# Patient Record
Sex: Female | Born: 1970 | Race: Asian | Hispanic: No | Marital: Married | Smoking: Never smoker
Health system: Southern US, Community
[De-identification: ages and names within clinical notes are randomized; demographics above are authoritative.]

## PROBLEM LIST (undated history)

## (undated) DIAGNOSIS — K2270 Barrett's esophagus without dysplasia: Secondary | ICD-10-CM

## (undated) DIAGNOSIS — E785 Hyperlipidemia, unspecified: Secondary | ICD-10-CM

## (undated) DIAGNOSIS — K317 Polyp of stomach and duodenum: Secondary | ICD-10-CM

## (undated) DIAGNOSIS — K259 Gastric ulcer, unspecified as acute or chronic, without hemorrhage or perforation: Secondary | ICD-10-CM

## (undated) DIAGNOSIS — R251 Tremor, unspecified: Secondary | ICD-10-CM

## (undated) DIAGNOSIS — E559 Vitamin D deficiency, unspecified: Secondary | ICD-10-CM

## (undated) HISTORY — DX: Tremor, unspecified: R25.1

## (undated) HISTORY — DX: Hyperlipidemia, unspecified: E78.5

## (undated) HISTORY — DX: Barrett's esophagus without dysplasia: K22.70

## (undated) HISTORY — DX: Gastric ulcer, unspecified as acute or chronic, without hemorrhage or perforation: K25.9

## (undated) HISTORY — DX: Polyp of stomach and duodenum: K31.7

## (undated) HISTORY — DX: Vitamin D deficiency, unspecified: E55.9

## (undated) HISTORY — PX: OVARIAN CYST REMOVAL: SHX89

---

## 2015-05-14 ENCOUNTER — Other Ambulatory Visit: Payer: Self-pay | Admitting: Internal Medicine

## 2015-05-14 DIAGNOSIS — Z1231 Encounter for screening mammogram for malignant neoplasm of breast: Secondary | ICD-10-CM

## 2015-05-23 ENCOUNTER — Ambulatory Visit
Admission: RE | Admit: 2015-05-23 | Discharge: 2015-05-23 | Disposition: A | Discharge status: Home / Self Care | Payer: 59 | Source: Ambulatory Visit | Attending: Internal Medicine | Admitting: Internal Medicine

## 2015-05-23 DIAGNOSIS — Z1231 Encounter for screening mammogram for malignant neoplasm of breast: Secondary | ICD-10-CM

## 2016-06-03 IMAGING — MG MM SCREEN MAMMOGRAM BILATERAL
4 series · 4 of 4 positions shown · non-contrast
Comparison: None.

CLINICAL DATA: Screening. Baseline study.

EXAM:
DIGITAL SCREENING BILATERAL MAMMOGRAM WITH CAD

[R CC]
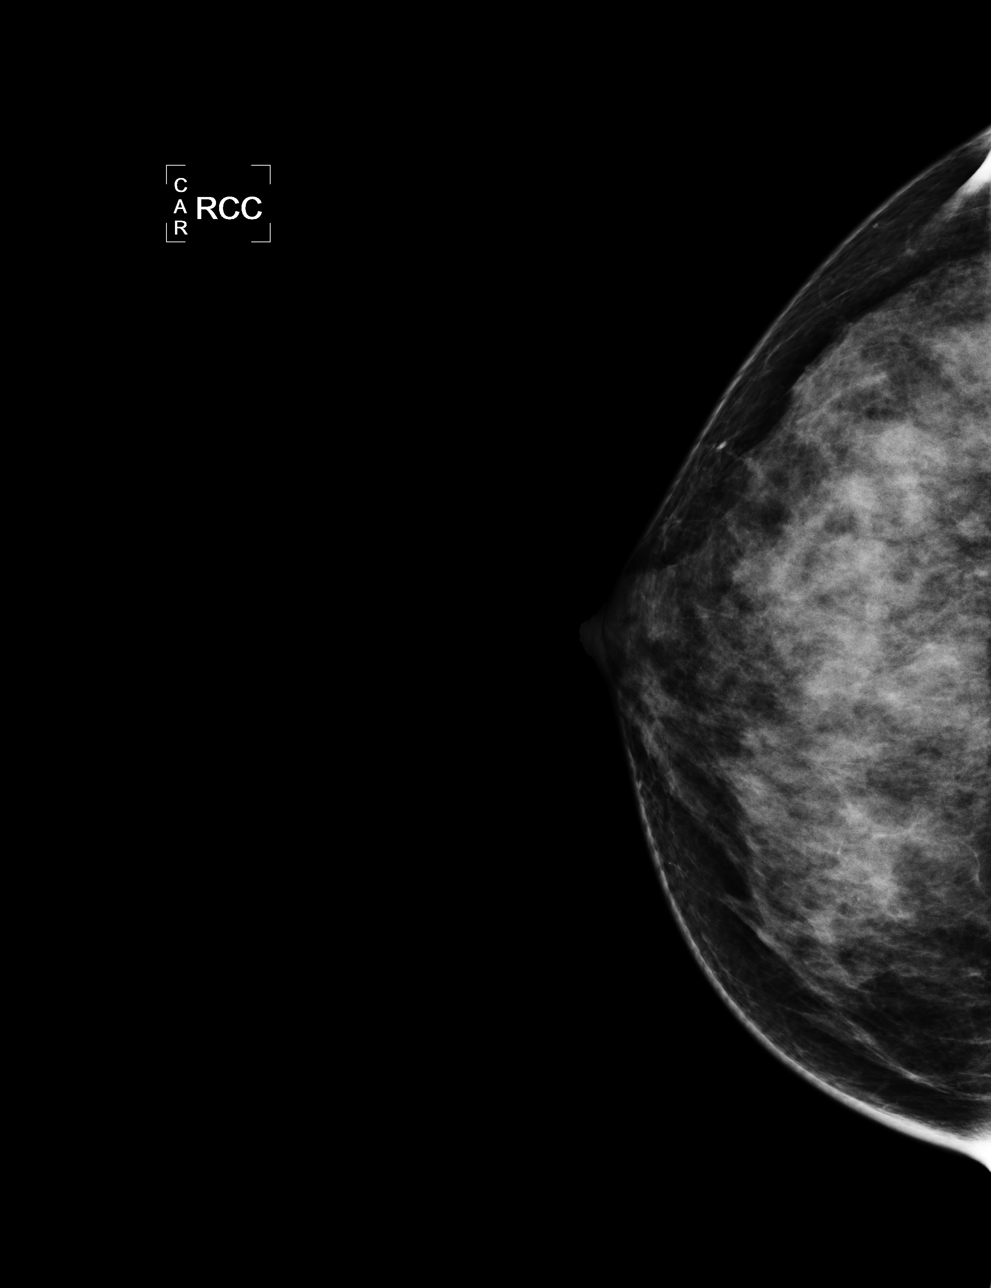

[L CC]
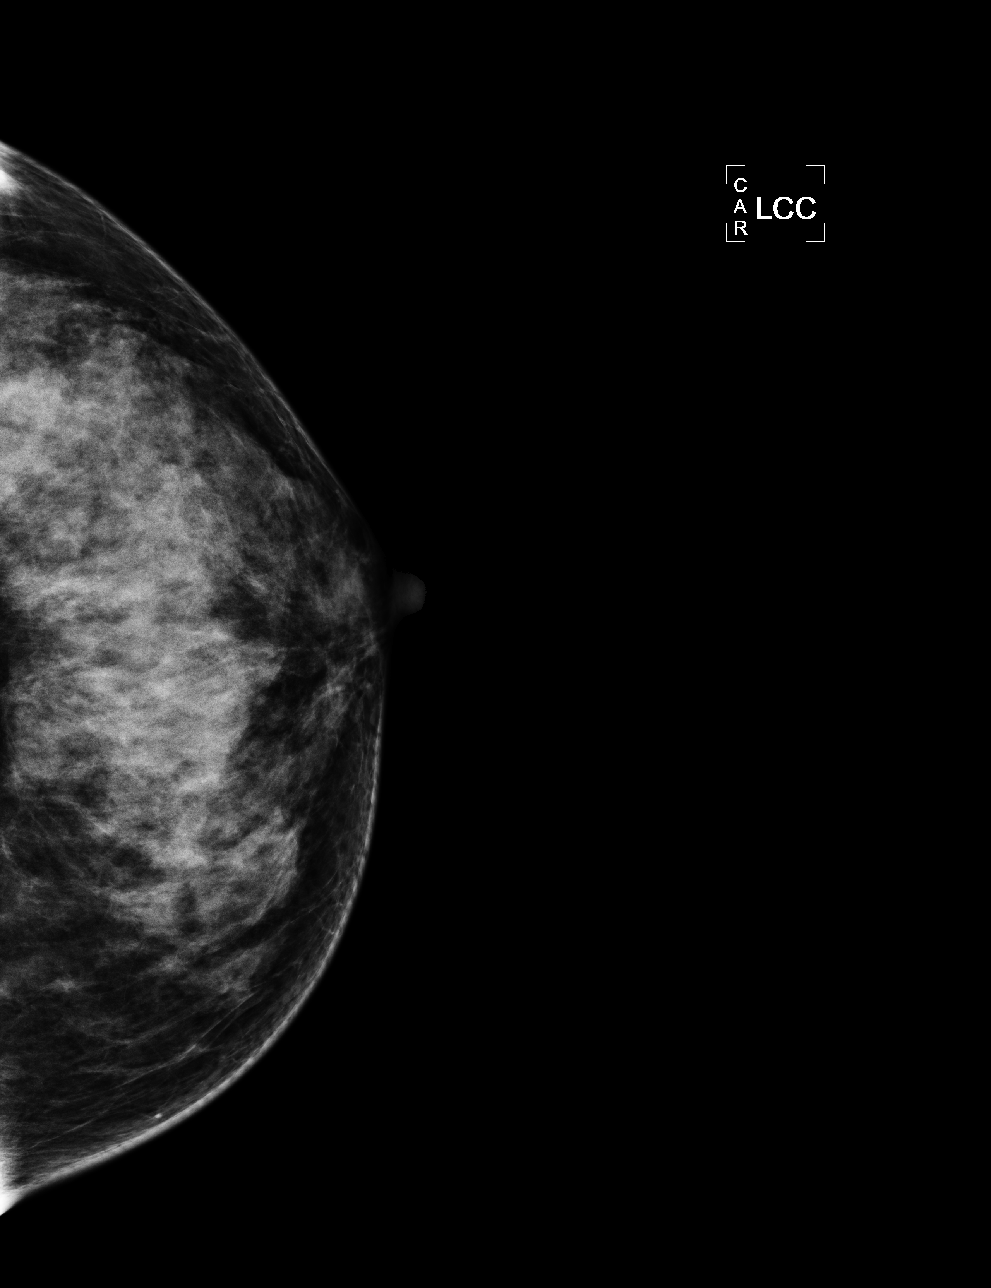

[L MLO]
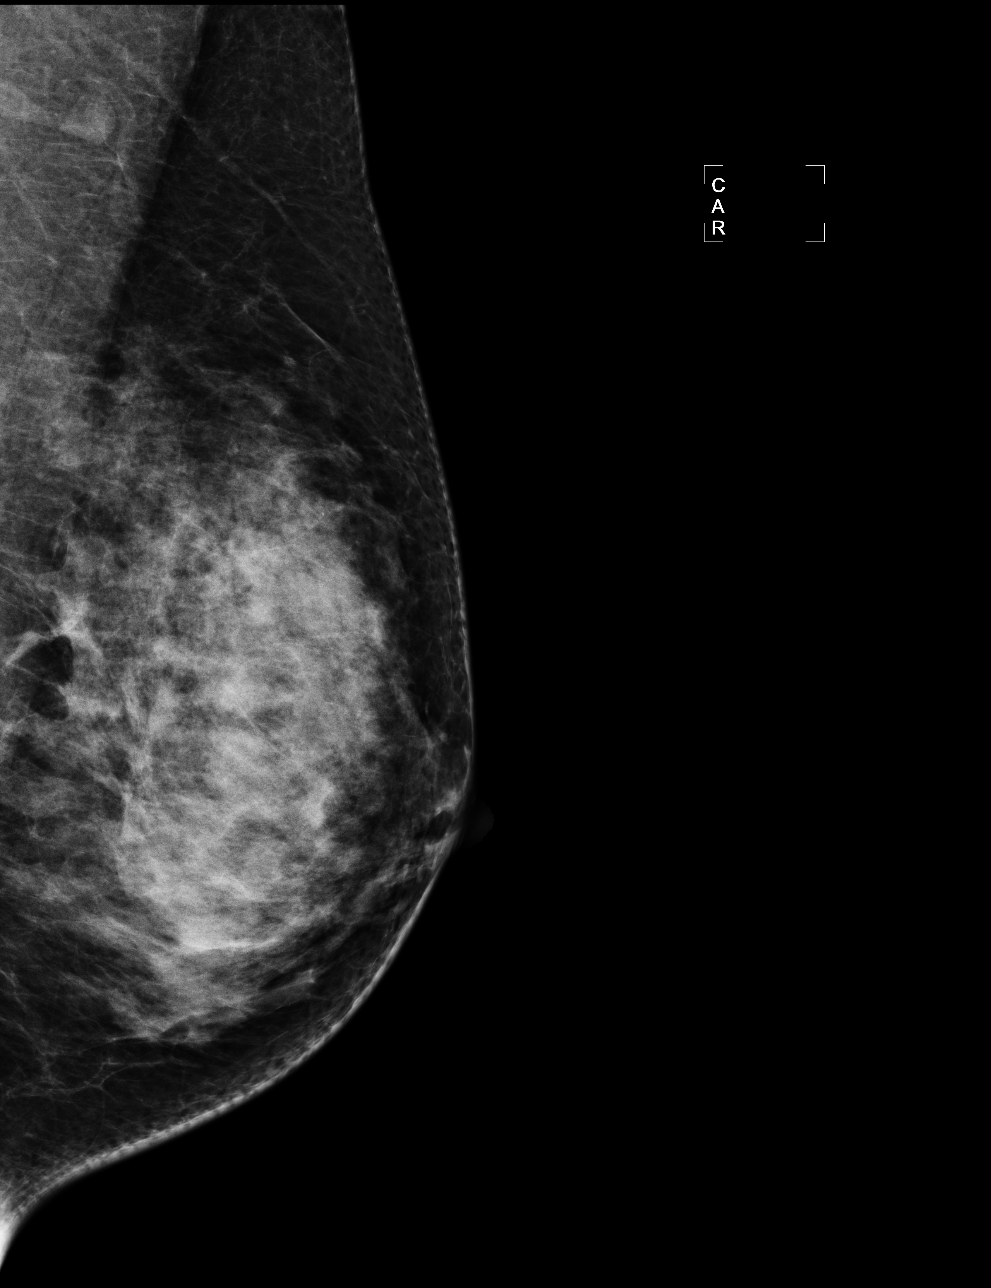

[R MLO]
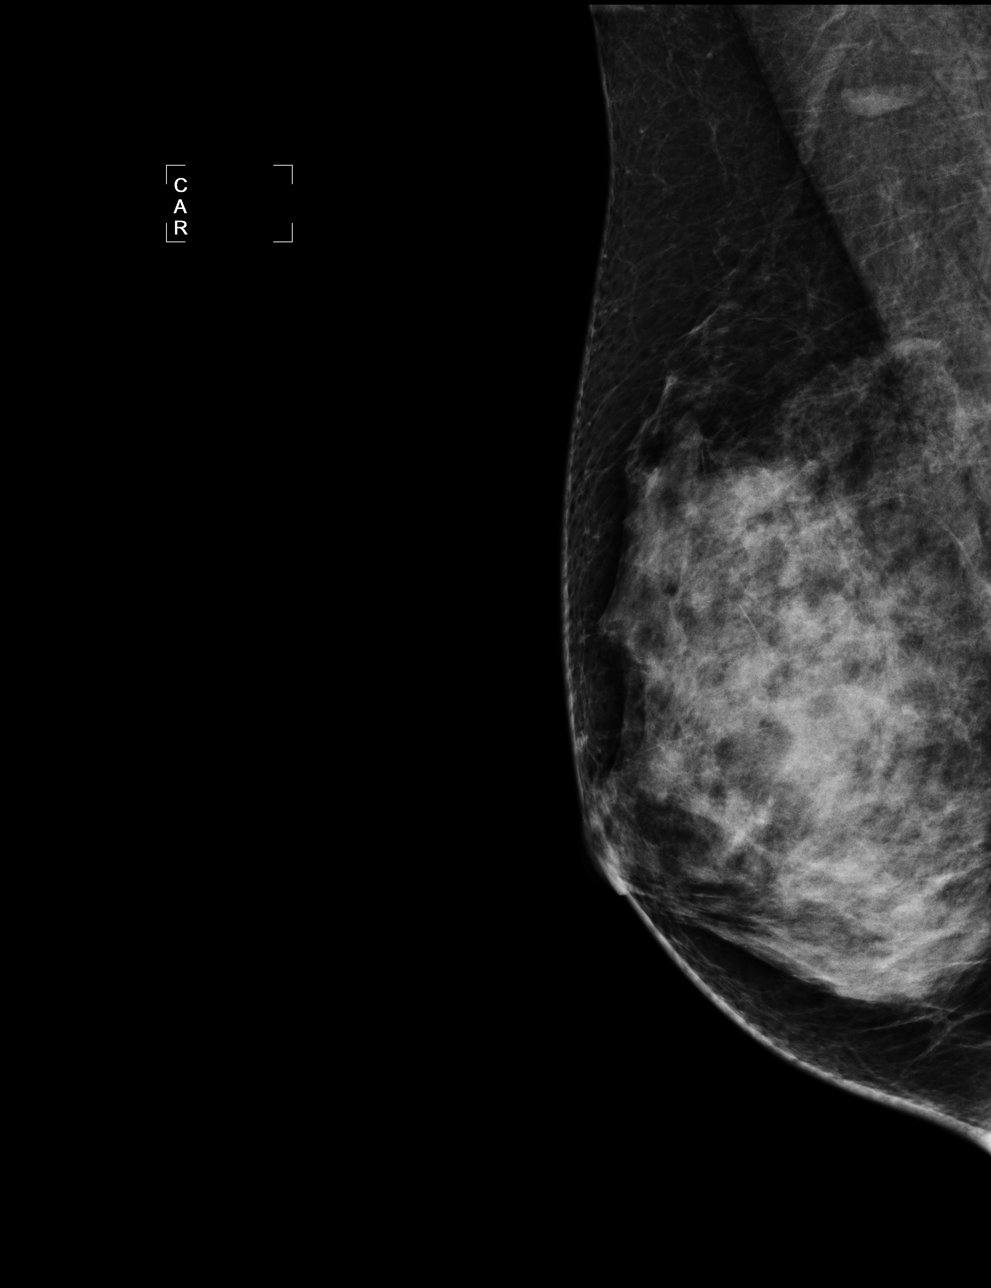

[4 of 4 positions shown; findings below may reference images not displayed]

ACR Breast Density Category d: The breast tissue is extremely dense,
which lowers the sensitivity of mammography.
FINDINGS: There are no findings suspicious for malignancy. Images were
processed with CAD.
IMPRESSION: No mammographic evidence of malignancy. A result letter of this
screening mammogram will be mailed directly to the patient.

RECOMMENDATION:
Screening mammogram in one year. (Code:[id number]-[id number])

BI-RADS CATEGORY  1: Negative.

## 2018-05-19 ENCOUNTER — Ambulatory Visit (INDEPENDENT_AMBULATORY_CARE_PROVIDER_SITE_OTHER): Payer: BLUE CROSS/BLUE SHIELD | Admitting: Neurology

## 2018-05-19 ENCOUNTER — Encounter: Payer: Self-pay | Admitting: *Deleted

## 2018-05-19 ENCOUNTER — Encounter: Payer: Self-pay | Admitting: Neurology

## 2018-05-19 VITALS — BP 119/78 | HR 78 | Ht 63.0 in | Wt 123.0 lb

## 2018-05-19 DIAGNOSIS — R251 Tremor, unspecified: Secondary | ICD-10-CM

## 2018-05-19 NOTE — Patient Instructions (Signed)
Essential tremor

## 2018-05-19 NOTE — Progress Notes (Signed)
PATIENT: Casey Harris DOB: 1971/01/13  Chief Complaint  Patient presents with  . Tremors    She is here with her son, Casey Harris, to have her left hand tremor evaluated.  Marland Kitchen PCP    Salli Real, MD     HISTORICAL  Casey Harris is a 47 year old female, seen in request by her primary care doctor Dr. 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   REVIEW OF SYSTEMS: Full 14 system review of systems performed and notable only for as above.  ALLERGIES: No Known Allergies  HOME MEDICATIONS: No current outpatient medications on file.   No current facility-administered medications for this visit.     PAST MEDICAL HISTORY: Past Medical History:  Diagnosis Date  . Barrett esophagus   . Gastric polyp   . Gastric ulcer   . Hyperlipemia   . Tremor of left hand   . Vitamin D deficiency     PAST SURGICAL HISTORY: History reviewed. No pertinent surgical history.  FAMILY HISTORY: History reviewed. No pertinent family history.  SOCIAL HISTORY: Social History   Socioeconomic History  . Marital status: Married    Spouse name: Not on file  . Number of children: Not on file  . Years of education: Not on file  . Highest education level: Not on file  Occupational History  . Not on file  Social Needs  . Financial resource strain: Not on file  . Food insecurity:    Worry: Not on file    Inability: Not on file  . Transportation needs:    Medical: Not on file    Non-medical: Not on file  Tobacco Use  . Smoking status: Never Smoker  . Smokeless tobacco: Never Used  Substance  and Sexual Activity  . Alcohol use: Not Currently  . Drug use: Never  . Sexual activity: Not on file  Lifestyle  . Physical activity:    Days per week: Not on file    Minutes per session: Not on file  . Stress: Not on file  Relationships  . Social connections:    Talks on phone: Not on file    Gets together: Not on file    Attends religious service: Not on file    Active member of club or organization: Not on file    Attends meetings of clubs or organizations: Not on file    Relationship status: Not on file  . Intimate partner violence:    Fear of current or ex partner: Not on file    Emotionally abused: Not on file    Physically abused: Not on file    Forced sexual activity: Not on file  Other Topics Concern  . Not on file  Social History Narrative   Caffeine use:     PHYSICAL EXAM   Vitals:   05/19/18 1403  BP: 119/78  Pulse: 78  Weight: 123 lb (55.8 kg)  Height: 5\' 3"  (1.6 m)    Not recorded      Body mass index is 21.79 kg/m.  PHYSICAL EXAMNIATION:  Gen: NAD, conversant, well nourised, obese,  well groomed                     Cardiovascular: Regular rate rhythm, no peripheral edema, warm, nontender. Eyes: Conjunctivae clear without exudates or hemorrhage Neck: Supple, no carotid bruits. Pulmonary: Clear to auscultation bilaterally   NEUROLOGICAL EXAM:  MENTAL STATUS: Speech:    Speech is normal; fluent and spontaneous with normal comprehension.  Cognition:     Orientation to time, place and person     Normal recent and remote memory     Normal Attention span and concentration     Normal Language, naming, repeating,spontaneous speech     Fund of knowledge   CRANIAL NERVES: CN II: Visual fields are full to confrontation. Fundoscopic exam is normal with sharp discs and no vascular changes. Pupils are round equal and briskly reactive to light. CN III, IV, VI: extraocular movement are normal. No ptosis. CN V: Facial sensation is intact to pinprick in  all 3 divisions bilaterally. Corneal responses are intact.  CN VII: Face is symmetric with normal eye closure and smile. CN VIII: Hearing is normal to rubbing fingers CN IX, X: Palate elevates symmetrically. Phonation is normal. CN XI: Head turning and shoulder shrug are intact CN XII: Tongue is midline with normal movements and no atrophy.  MOTOR: There is no pronator drift of out-stretched arms. Muscle bulk and tone are normal. Muscle strength is normal.  REFLEXES: Reflexes are 2+ and symmetric at the biceps, triceps, knees, and ankles. Plantar responses are flexor.  SENSORY: Intact to light touch, pinprick, positional sensation and vibratory sensation are intact in fingers and toes.  COORDINATION: Rapid alternating movements and fine finger movements are intact. There is no dysmetria on finger-to-nose and heel-knee-shin.    GAIT/STANCE: Posture is normal. Gait is steady with normal steps, base, arm swing, and turning. Heel and toe walking are normal. Tandem gait is normal.  Romberg is absent.   DIAGNOSTIC DATA (LABS, IMAGING, TESTING) - I reviewed patient records, labs, notes, testing and imaging myself where available.   ASSESSMENT AND PLAN  Casey Harris is a 47 y.o. female   Intermittent left hand posturing tremor  Patient diagnosis including exacerbated physiological tremorl versus essential tremor  No parkinsonian features  Check TSH  Return to clinic for new issues    Casey Harris, M.D. Ph.D.  [facility] [address], [address] [place], [place] [address] Ph: [phone] Fax: [phone]  CC: Salli RealSun, Yun, MD

## 2018-05-20 ENCOUNTER — Telehealth: Payer: Self-pay | Admitting: *Deleted

## 2018-05-20 LAB — TSH: TSH: 0.992 u[IU]/mL (ref 0.450–4.500)

## 2018-05-20 NOTE — Telephone Encounter (Signed)
-----   Message from York Spanielharles K Willis, MD sent at 05/20/2018 10:18 AM EDT ----- Normal TSH is noted.  Please call the patient. ----- Message ----- From: Lilla ShookKirkman, Michelle C, RN Sent: 05/20/2018   8:24 AM To: York Spanielharles K Willis, MD

## 2018-05-20 NOTE — Telephone Encounter (Signed)
Left message on her son's vm (he speaks english and on DPR).  Notified of normal TSH.

## 2019-12-30 ENCOUNTER — Ambulatory Visit: Payer: Self-pay | Attending: Internal Medicine

## 2019-12-30 DIAGNOSIS — Z23 Encounter for immunization: Secondary | ICD-10-CM

## 2019-12-30 NOTE — Progress Notes (Signed)
   Covid-19 Vaccination Clinic  Name:  Casey Harris    MRN: [id number] DOB: [birth year]  12/30/2019  [name] was observed post Covid-19 immunization for 15 minutes without incident. She was provided with Vaccine Information Sheet and instruction to access the V-Safe system.   [name] was instructed to call 911 with any severe reactions post vaccine: Marland Kitchen Difficulty breathing  . Swelling of face and throat  . A fast heartbeat  . A bad rash all over body  . Dizziness and weakness   Immunizations Administered    Name Date Dose VIS Date Route   Pfizer COVID-19 Vaccine 12/30/2019 12:58 PM 0.3 mL 09/29/2019 Intramuscular   Manufacturer: [employer], [employer]   Lot: [id number]   NDC: [address]-7

## 2020-01-23 ENCOUNTER — Ambulatory Visit: Payer: Self-pay | Attending: Internal Medicine

## 2020-01-23 DIAGNOSIS — Z23 Encounter for immunization: Secondary | ICD-10-CM

## 2020-01-23 NOTE — Progress Notes (Signed)
   Covid-19 Vaccination Clinic  Name:  Tijuana Scheidegger    MRN: [id number] DOB: 09-17-1971  01/23/2020  Ms. Spillers was observed post Covid-19 immunization for 15 minutes without incident. She was provided with Vaccine Information Sheet and instruction to access the V-Safe system.   Ms. Dehner was instructed to call 911 with any severe reactions post vaccine: Marland Kitchen Difficulty breathing  . Swelling of face and throat  . A fast heartbeat  . A bad rash all over body  . Dizziness and weakness   Immunizations Administered    Name Date Dose VIS Date Route   Pfizer COVID-19 Vaccine 01/23/2020 12:47 PM 0.3 mL 09/29/2019 Intramuscular   Manufacturer: [employer], [employer]   Lot: [id number]   NDC: [address]-5

## 2020-01-28 DIAGNOSIS — R87619 Unspecified abnormal cytological findings in specimens from cervix uteri: Secondary | ICD-10-CM | POA: Insufficient documentation

## 2020-01-29 DIAGNOSIS — M542 Cervicalgia: Secondary | ICD-10-CM | POA: Insufficient documentation

## 2020-01-29 DIAGNOSIS — M5442 Lumbago with sciatica, left side: Secondary | ICD-10-CM | POA: Insufficient documentation

## 2020-01-29 DIAGNOSIS — R1013 Epigastric pain: Secondary | ICD-10-CM | POA: Insufficient documentation

## 2020-01-29 DIAGNOSIS — G8929 Other chronic pain: Secondary | ICD-10-CM | POA: Insufficient documentation

## 2020-02-12 DIAGNOSIS — K644 Residual hemorrhoidal skin tags: Secondary | ICD-10-CM | POA: Insufficient documentation

## 2020-05-31 ENCOUNTER — Ambulatory Visit (INDEPENDENT_AMBULATORY_CARE_PROVIDER_SITE_OTHER): Payer: BLUE CROSS/BLUE SHIELD | Admitting: Family Medicine

## 2020-05-31 ENCOUNTER — Other Ambulatory Visit: Payer: Self-pay

## 2020-05-31 ENCOUNTER — Encounter: Payer: Self-pay | Admitting: Family Medicine

## 2020-05-31 DIAGNOSIS — G8929 Other chronic pain: Secondary | ICD-10-CM

## 2020-05-31 DIAGNOSIS — M545 Low back pain: Secondary | ICD-10-CM | POA: Diagnosis not present

## 2020-05-31 DIAGNOSIS — M542 Cervicalgia: Secondary | ICD-10-CM

## 2020-05-31 MED ORDER — BACLOFEN 10 MG PO TABS: 5.0000 mg | ORAL_TABLET | Freq: Three times a day (TID) | ORAL | 3 refills | Status: DC | PRN

## 2020-05-31 NOTE — Patient Instructions (Signed)
    I would like for you to go to physical therapy for the next 6 weeks.  If you still don't feel better after that, we will order MRI scan of your neck and back.  I will call in a prescription for baclofen, a different muscle relaxant.  I recommend using carpal tunnel wrist braces while sleeping at night to stop the numbness.  These are available at [facility].

## 2020-05-31 NOTE — Progress Notes (Signed)
Office Visit Note   Patient: Casey Harris           Date of Birth: [birth year]           MRN: [id number] Visit Date: 05/31/2020 Requested by: Salli Real, MD [address] [place],  [place] [address] PCP: Salli Real, MD  Subjective: Chief Complaint  Patient presents with  . Neck - Pain    NKI. 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 Pain in neck and lower back started last September. Worsened last October - better w/massage tx. Pain again in July. Numbness in the fingers of both hands.  . Lower Back - Pain    HPI: She is here with neck and low back pain.  Interpreter was present today.  Symptoms started about a year ago, no injury.  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  Occasionally she gets tingling in her hands while sleeping at night, but not during the day.  Her neck feels stiff, and her low back hurts in the midline.  No radicular symptoms in the lower extremities.  She has been to a massage therapist a couple times with some relief.  She had x-rays in April showing straightening of the cervical spine and early facet degenerative change per radiology report.  I don't have access to those x-rays.  She is not taking medications for her pain on a regular basis but she did try Flexeril.               ROS:   Denies fevers, chills, night sweats, unintentional weight change.  All other systems were reviewed and are negative.  Objective: Vital Signs: There were no vitals taken for this visit.  Physical Exam:  General:  Alert and oriented, in no acute distress. Pulm:  Breathing unlabored. Psy:  Normal mood, congruent affect. Skin: No visible rash. Neck: She has good range of motion with negative Spurling's test.  She has some tenderness in the upper cervical paraspinous muscles bilaterally.  Upper extremity strength and reflexes are normal. Low back: Tender in the midline over the L5-S1 level.  No pain over the SI joints or in the sciatic  notch.  Straight leg raise negative, lower extremity strength and reflexes are normal.  She has no scoliosis. Hands: Negative Tinel's at the carpal tunnel, negative Phalen's test, no thenar atrophy.   Imaging: None today  Assessment & Plan: 1.  Chronic neck and low back pain, probably myofascial -We will try physical therapy in [place].  If she fails to improve, then MRI scan of the cervical and lumbar spine.  2.  Bilateral hand numbness, etiology uncertain.  Cannot rule out carpal tunnel syndrome. -She will try carpal tunnel night splints.     Procedures: No procedures performed  No notes on file     PMFS History: Patient Active Problem List   Diagnosis Date Noted  . Residual hemorrhoidal skin tags 02/12/2020  . Chronic midline low back pain with left-sided sciatica 01/29/2020  . Epigastric abdominal pain 01/29/2020  . Neck pain 01/29/2020  . Abnormal Pap smear of cervix 01/28/2020  . Tremor 05/19/2018   Past Medical History:  Diagnosis Date  . Barrett esophagus   . Gastric polyp   . Gastric ulcer   . Hyperlipemia   . Tremor of left hand   . Vitamin D deficiency     History reviewed. No pertinent family history.  History reviewed. No pertinent surgical history.  Social History   Occupational History  . Not on file  Tobacco Use  . Smoking status: Never Smoker  . Smokeless tobacco: Never Used  Vaping Use  . Vaping Use: Never used  Substance and Sexual Activity  . Alcohol use: Not Currently  . Drug use: Never  . Sexual activity: Not on file

## 2020-06-12 ENCOUNTER — Ambulatory Visit: Payer: BLUE CROSS/BLUE SHIELD | Admitting: Rehabilitative and Restorative Service Providers"

## 2023-08-23 ENCOUNTER — Other Ambulatory Visit: Payer: Self-pay

## 2023-08-23 ENCOUNTER — Ambulatory Visit (INDEPENDENT_AMBULATORY_CARE_PROVIDER_SITE_OTHER): Payer: BLUE CROSS/BLUE SHIELD

## 2023-08-23 ENCOUNTER — Ambulatory Visit
Admission: RE | Admit: 2023-08-23 | Discharge: 2023-08-23 | Disposition: A | Discharge status: Home / Self Care | Payer: BLUE CROSS/BLUE SHIELD | Source: Ambulatory Visit | Attending: Family Medicine | Admitting: Family Medicine

## 2023-08-23 VITALS — BP 120/81 | HR 62 | Temp 98.0°F | Resp 16

## 2023-08-23 DIAGNOSIS — M25512 Pain in left shoulder: Secondary | ICD-10-CM

## 2023-08-23 DIAGNOSIS — Z8639 Personal history of other endocrine, nutritional and metabolic disease: Secondary | ICD-10-CM

## 2023-08-23 DIAGNOSIS — M545 Low back pain, unspecified: Secondary | ICD-10-CM | POA: Diagnosis not present

## 2023-08-23 DIAGNOSIS — M542 Cervicalgia: Secondary | ICD-10-CM

## 2023-08-23 DIAGNOSIS — M25511 Pain in right shoulder: Secondary | ICD-10-CM

## 2023-08-23 DIAGNOSIS — R52 Pain, unspecified: Secondary | ICD-10-CM | POA: Diagnosis not present

## 2023-08-23 MED ORDER — PREDNISONE 20 MG PO TABS: ORAL_TABLET | ORAL | 0 refills | Status: AC

## 2023-08-23 NOTE — ED Triage Notes (Addendum)
Bil shoulder, low back pain, leg stiffness x 1 month. Feels stiff and hard to walk. Went to the ER in September and problem has came and gone. Yesterday could not get up due to pain in shoulders.

## 2023-08-23 NOTE — ED Notes (Signed)
Unsuccessful blood draw attempt x 1, [facility]

## 2023-08-23 NOTE — Discharge Instructions (Addendum)
Take the prednisone as directed Check my chart for the test results Make an appointment to see your primary care doctor in the next 2 weeks X rays are normal except for mild changes of arthritis, normal for age You need to take vitamin D supplement daily1000-2000IU

## 2023-08-23 NOTE — ED Provider Notes (Addendum)
[facility]    CSN: [id number] Arrival date & time: 08/23/23  6948      History   Chief Complaint Chief Complaint  Patient presents with   Shoulder Pain    Entered by patient    HPI Casey Harris is a 52 y.o. female.   HPI Patient is here with her son.  She speaks very little [place].  He speaks a little bit more.  She is seen with the help of a Cantonese interpreter. She has had pain for well over a month.  She has had another urgent care visit.  She has tried to see her primary care doctor but the visit got canceled.  She complains of pain in her neck and shoulder area.  She also has pain in both of her wrists and hands.  Has not noticed any swelling or redness of her joints.  She states that she needs help getting out of bed in the morning.  She states that her son and her husband have been helping her.  Pain initially was in her neck and upper body but now she is having low back pain as well.  Some pain into her legs.  She massages her calf and states that this muscle hurts.  No real weakness.  Limited movement because of the pain.  She has taken ibuprofen 400 mg with food.  This gives her temporary relief. She has never had joint problems for rheumatic condition.  That she knows of it does not run in her family.  Her mother has Parkinson's disease. She is generally in good health and on no prescription medication. Chart review indicates she has a history of vitamin D deficiency.  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      Home Medications    Prior to Admission medications   Medication Sig Start Date End Date Taking? Authorizing Provider  predniSONE (DELTASONE) 20 MG tablet Take 2 tablets a day for 3 days then one a day 08/23/23  Yes Eustace Moore, MD    Family History History reviewed. No pertinent family history.  Social History Social History   Tobacco Use   Smoking status: Never   Smokeless tobacco: Never  Vaping Use   Vaping status: Never Used  Substance Use Topics   Alcohol use: Not Currently   Drug use: Never     Allergies   Patient has no known allergies.   Review of Systems Review of Systems See HPI  Physical Exam Triage Vital Signs ED Triage Vitals  Encounter Vitals Group     BP 08/23/23 0857 120/81     Systolic BP Percentile --  Diastolic BP Percentile --      Pulse Rate 08/23/23 0857 62     Resp 08/23/23 0857 16     Temp 08/23/23 0857 98 F (36.7 C)     Temp Source 08/23/23 0857 Oral     SpO2 08/23/23 0857 99 %     Weight --      Height --      Head Circumference --      Peak Flow --      Pain Score 08/23/23 0901 8     Pain Loc --      Pain Education --      Exclude from Growth Chart --    No data found.  Updated Vital Signs BP 120/81   Pulse 62   Temp 98 F (36.7 C) (Oral)   Resp 16   SpO2 99%      Physical Exam Constitutional:      General: She is not in acute distress.    Appearance: She is well-developed and normal weight.  HENT:     Head: Normocephalic and atraumatic.  Eyes:     Conjunctiva/sclera: Conjunctivae normal.     Pupils: Pupils are equal, round, and reactive to light.  Neck:     Comments: Tenderness bilaterally in the paraspinous cervical muscles and then the upper body of  the trapezius.  Full but slow range of motion.  No limitation in joints of shoulder elbow wrist or hand.   Cardiovascular:     Rate and Rhythm: Normal rate.  Pulmonary:     Effort: Pulmonary effort is normal. No respiratory distress.  Abdominal:     General: There is no distension.     Palpations: Abdomen is soft.  Musculoskeletal:        General: Normal range of motion.     Cervical back: Normal range of motion. Tenderness present.     Comments: Mild tenderness in the muscles of the lumbar spine.  Strength sensation range of motion reflexes normal in extremities.  Skin:    General: Skin is warm and dry.  Neurological:     General: No focal deficit present.     Mental Status: She is alert. Mental status is at baseline.     Motor: No weakness.     Gait: Gait normal.      UC Treatments / Results  Labs (all labs ordered are listed, but only abnormal results are displayed) Labs Reviewed  CBC WITH DIFFERENTIAL/PLATELET  SEDIMENTATION RATE  25-HYDROXY VITAMIN D LCMS D2+D3  C-REACTIVE PROTEIN  RHEUMATOID FACTOR    EKG   Radiology DG Lumbar Spine Complete  Result Date: 08/23/2023 CLINICAL DATA:  neck and back pain worsening over months in spite of treatment. Lower back pain and leg stiffness. EXAM: LUMBAR SPINE - COMPLETE 4+ VIEW COMPARISON:  None Available. FINDINGS: Five views of the lumbar spine. Normal vertebral body heights and alignment. Moderate disc height loss at L5-S1. Mild bilateral facet arthropathy from L3-4 through L5-S1. No evidence of lumbar spine fracture. IMPRESSION: Moderate disc height loss at L5-S1. Mild bilateral facet arthropathy from L3-4 through L5-S1. Electronically Signed   By: Orvan Falconer M.D.   On: 08/23/2023 10:41   DG Cervical Spine Complete  Result Date: 08/23/2023 CLINICAL DATA:  neck and back pain worsening over months in spite of treatment. Bilateral shoulder pain. EXAM: CERVICAL SPINE - COMPLETE 4+ VIEW COMPARISON:  None Available. FINDINGS:  Six views of the cervical spine. Normal vertebral body heights. Trace degenerative retrolisthesis of  C5 on C6. Mild bilateral facet arthropathy at C7-T1. Right-greater-than-left upper cervical facet arthropathy results in moderate right neural foraminal narrowing at C3-4 and C4-5. IMPRESSION: Right-greater-than-left upper cervical facet arthropathy results in moderate right neural foraminal narrowing at C3-4 and C4-5. Electronically Signed   By: Orvan Falconer M.D.   On: 08/23/2023 10:38    Procedures Procedures (including critical care time)  Medications Ordered in UC Medications - No data to display  Initial Impression / Assessment and Plan / UC Course  I have reviewed the triage vital signs and the nursing notes.  Pertinent labs & imaging results that were available during my care of the patient were reviewed by me and considered in my medical decision making (see chart for details).     I explained to the patient she had presented me with a challenge.  Body wide pain for a month is not easily identified on a single examination at an urgent care center.  X-rays were done and are negative for acute finding, or cause of her diffuse symptoms.  Blood work is pending.  The importance of following up with her primary care doctor to interpret her blood works and for additional treatment and testing is emphasized Final Clinical Impressions(s) / UC Diagnoses   Final diagnoses:  Complaints of total body pain  Nonspecific pain in the neck region  Acute midline low back pain without sciatica  History of vitamin D deficiency     Discharge Instructions      Take the prednisone as directed Check my chart for the test results Make an appointment to see your primary care doctor in the next 2 weeks X rays are normal You need to take vitamin D supplement daily1000-2000IU    ED Prescriptions     Medication Sig Dispense Auth. Provider   predniSONE (DELTASONE) 20 MG tablet Take 2 tablets a day  for 3 days then one a day 30 tablet Delton See Letta Pate, MD      PDMP not reviewed this encounter.   Eustace Moore, MD 08/23/23 1045    Eustace Moore, MD 08/23/23 [phone]

## 2023-08-24 LAB — RHEUMATOID FACTOR: Rheumatoid fact SerPl-aCnc: 10.6 [IU]/mL (ref <14.0)

## 2023-08-24 LAB — CBC WITH DIFFERENTIAL/PLATELET
Basophils Absolute: 0.1 10*3/uL (ref 0.0–0.2)
Basos: 1 %
EOS (ABSOLUTE): 0.2 10*3/uL (ref 0.0–0.4)
Eos: 3 %
Hematocrit: 41.3 % (ref 34.0–46.6)
Hemoglobin: 13.4 g/dL (ref 11.1–15.9)
Immature Grans (Abs): 0 10*3/uL (ref 0.0–0.1)
Immature Granulocytes: 0 %
Lymphocytes Absolute: 2.5 10*3/uL (ref 0.7–3.1)
Lymphs: 40 %
MCH: 29.8 pg (ref 26.6–33.0)
MCHC: 32.4 g/dL (ref 31.5–35.7)
MCV: 92 fL (ref 79–97)
Monocytes Absolute: 0.4 10*3/uL (ref 0.1–0.9)
Monocytes: 7 %
Neutrophils Absolute: 3.1 10*3/uL (ref 1.4–7.0)
Neutrophils: 49 %
Platelets: 256 10*3/uL (ref 150–450)
RBC: 4.5 x10E6/uL (ref 3.77–5.28)
RDW: 12.6 % (ref 11.7–15.4)
WBC: 6.3 10*3/uL (ref 3.4–10.8)

## 2023-08-24 LAB — C-REACTIVE PROTEIN: CRP: <1 mg/L (ref 0–10)

## 2023-08-24 LAB — SEDIMENTATION RATE: Sed Rate: 2 mm/h (ref 0–40)

## 2023-09-03 LAB — 25-HYDROXY VITAMIN D LCMS D2+D3
25-Hydroxy, Vitamin D-2: 1.4 ng/mL
25-Hydroxy, Vitamin D-3: 53 ng/mL
25-Hydroxy, Vitamin D: 54 ng/mL
